# Patient Record
Sex: Female | Born: 1985 | Race: Black or African American | Hispanic: No | Marital: Single | Smoking: Never smoker
Health system: Southern US, Community
[De-identification: ages and names within clinical notes are randomized; demographics above are authoritative.]

## PROBLEM LIST (undated history)

## (undated) HISTORY — PX: PELVIC ABCESS DRAINAGE: SHX2189

---

## 2013-06-27 ENCOUNTER — Ambulatory Visit (INDEPENDENT_AMBULATORY_CARE_PROVIDER_SITE_OTHER): Payer: BC Managed Care – PPO | Admitting: Podiatry

## 2013-06-27 ENCOUNTER — Encounter: Payer: Self-pay | Admitting: Podiatry

## 2013-06-27 ENCOUNTER — Ambulatory Visit (INDEPENDENT_AMBULATORY_CARE_PROVIDER_SITE_OTHER): Payer: BC Managed Care – PPO

## 2013-06-27 VITALS — BP 100/76 | HR 55 | Resp 16 | Ht 66.0 in | Wt 216.0 lb

## 2013-06-27 DIAGNOSIS — M79609 Pain in unspecified limb: Secondary | ICD-10-CM

## 2013-06-27 DIAGNOSIS — M79671 Pain in right foot: Secondary | ICD-10-CM

## 2013-06-27 DIAGNOSIS — M722 Plantar fascial fibromatosis: Secondary | ICD-10-CM

## 2013-06-27 MED ORDER — MELOXICAM 15 MG PO TABS: 15.0000 mg | ORAL_TABLET | Freq: Every day | ORAL | Status: DC

## 2013-06-27 MED ORDER — TRIAMCINOLONE ACETONIDE 10 MG/ML IJ SUSP
5.0000 mg | Freq: Once | INTRAMUSCULAR | Status: AC
  Administered 2013-06-27: 5 mg via INTRA_ARTICULAR

## 2013-06-27 NOTE — Progress Notes (Signed)
Subjective:     Patient ID: Vanessa Stephenson, female   DOB: Jan 15, 1986, 27 y.o.   MRN: [id number]  Foot Pain   patient presents stating I'm having pain and burning in my right heel and I have a small crack in the outside of my right heel. States it's been hurting for around a month and worse when getting up in the morning or after periods of sitting   Review of Systems  All other systems reviewed and are negative.       Objective:   Physical Exam  Nursing note and vitals reviewed. Constitutional: She is oriented to person, place, and time. She appears well-developed and well-nourished.  Cardiovascular: Intact distal pulses.   Musculoskeletal: Normal range of motion.  Neurological: She is oriented to person, place, and time.  Skin: Skin is warm.   patient is found to have pain in the plantar right heel central and medial band and a small crack in the outside of the right heel measuring about half a centimeter with no subcutaneous exposure. No equinus condition noted or muscle strength loss. Vibratory sensation intact both feet    Assessment:     Plantar fasciitis right heel with slight compensation to the outside of the foot leading to a small crack    Plan:     X-ray an H&P reviewed with patient. Injected the right plantar fascia 3 mg Kenalog 5 mg like a Marcaine mixture and applied fascial brace for support. Reappoint in 2 weeks
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
## 2013-07-11 ENCOUNTER — Ambulatory Visit: Payer: BC Managed Care – PPO | Admitting: Podiatry

## 2013-08-01 ENCOUNTER — Ambulatory Visit: Payer: BC Managed Care – PPO | Admitting: Podiatry

## 2014-01-31 ENCOUNTER — Emergency Department: Payer: Self-pay | Admitting: Emergency Medicine

## 2014-01-31 LAB — BASIC METABOLIC PANEL
ANION GAP: 6 — AB (ref 7–16)
BUN: 9 mg/dL (ref 7–18)
CHLORIDE: 106 mmol/L (ref 98–107)
CREATININE: 0.97 mg/dL (ref 0.60–1.30)
Calcium, Total: 8.6 mg/dL (ref 8.5–10.1)
Co2: 29 mmol/L (ref 21–32)
EGFR (African American): >60
EGFR (Non-African Amer.): >60
GLUCOSE: 98 mg/dL (ref 65–99)
OSMOLALITY: 280 (ref 275–301)
Potassium: 3.4 mmol/L — ABNORMAL LOW (ref 3.5–5.1)
SODIUM: 141 mmol/L (ref 136–145)

## 2014-01-31 LAB — CBC
HCT: 34.9 % — AB (ref 35.0–47.0)
HGB: 11.4 g/dL — ABNORMAL LOW (ref 12.0–16.0)
MCH: 30.2 pg (ref 26.0–34.0)
MCHC: 32.7 g/dL (ref 32.0–36.0)
MCV: 93 fL (ref 80–100)
Platelet: 214 10*3/uL (ref 150–440)
RBC: 3.77 10*6/uL — ABNORMAL LOW (ref 3.80–5.20)
RDW: 14.9 % — AB (ref 11.5–14.5)
WBC: 5.6 10*3/uL (ref 3.6–11.0)

## 2014-01-31 LAB — TROPONIN I: Troponin-I: <0.02 ng/mL

## 2014-02-01 LAB — D-DIMER(ARMC): D-Dimer: 1730 ng/ml

## 2014-02-01 LAB — TROPONIN I

## 2014-09-10 DIAGNOSIS — N7093 Salpingitis and oophoritis, unspecified: Secondary | ICD-10-CM | POA: Insufficient documentation

## 2014-09-10 DIAGNOSIS — A549 Gonococcal infection, unspecified: Secondary | ICD-10-CM | POA: Insufficient documentation

## 2015-05-23 IMAGING — CR DG CHEST 2V
1 series · 2 of 2 positions shown · non-contrast
Comparison: None.

CLINICAL DATA: Wheezing and chest pain

EXAM:
CHEST  2 VIEW

[Series 1: w chest pa · 0.14mm/px · 2 of 2 slices shown]
[im 1/2]
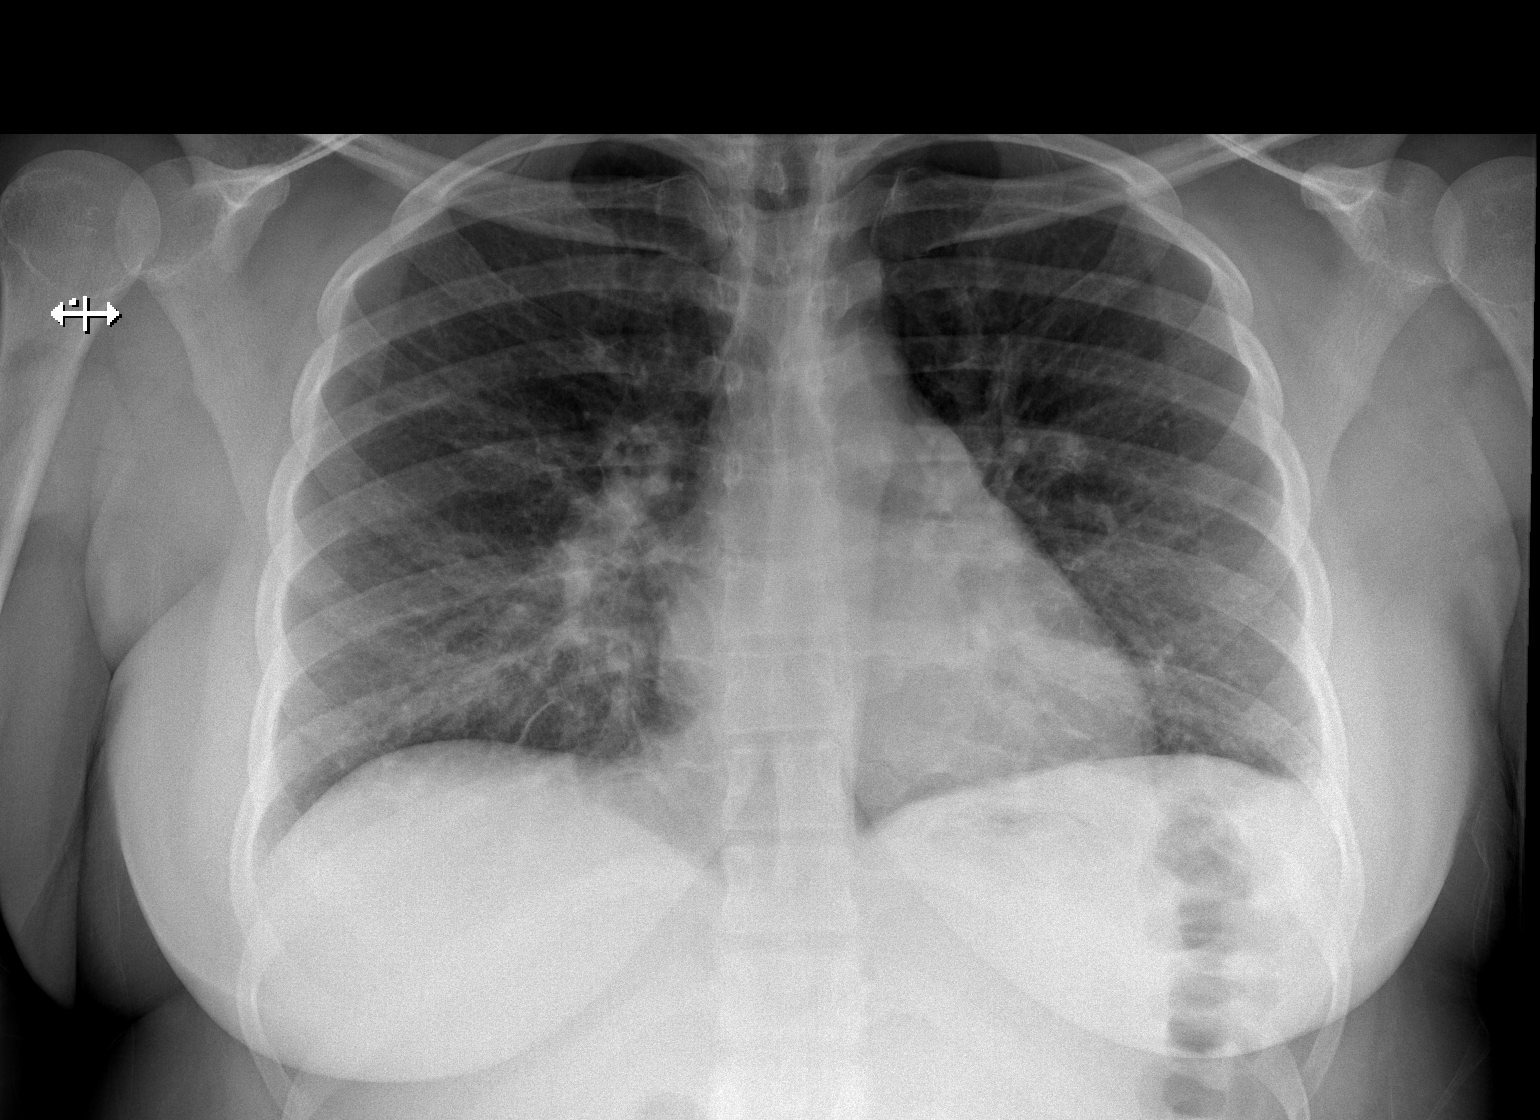
[im 2/2]
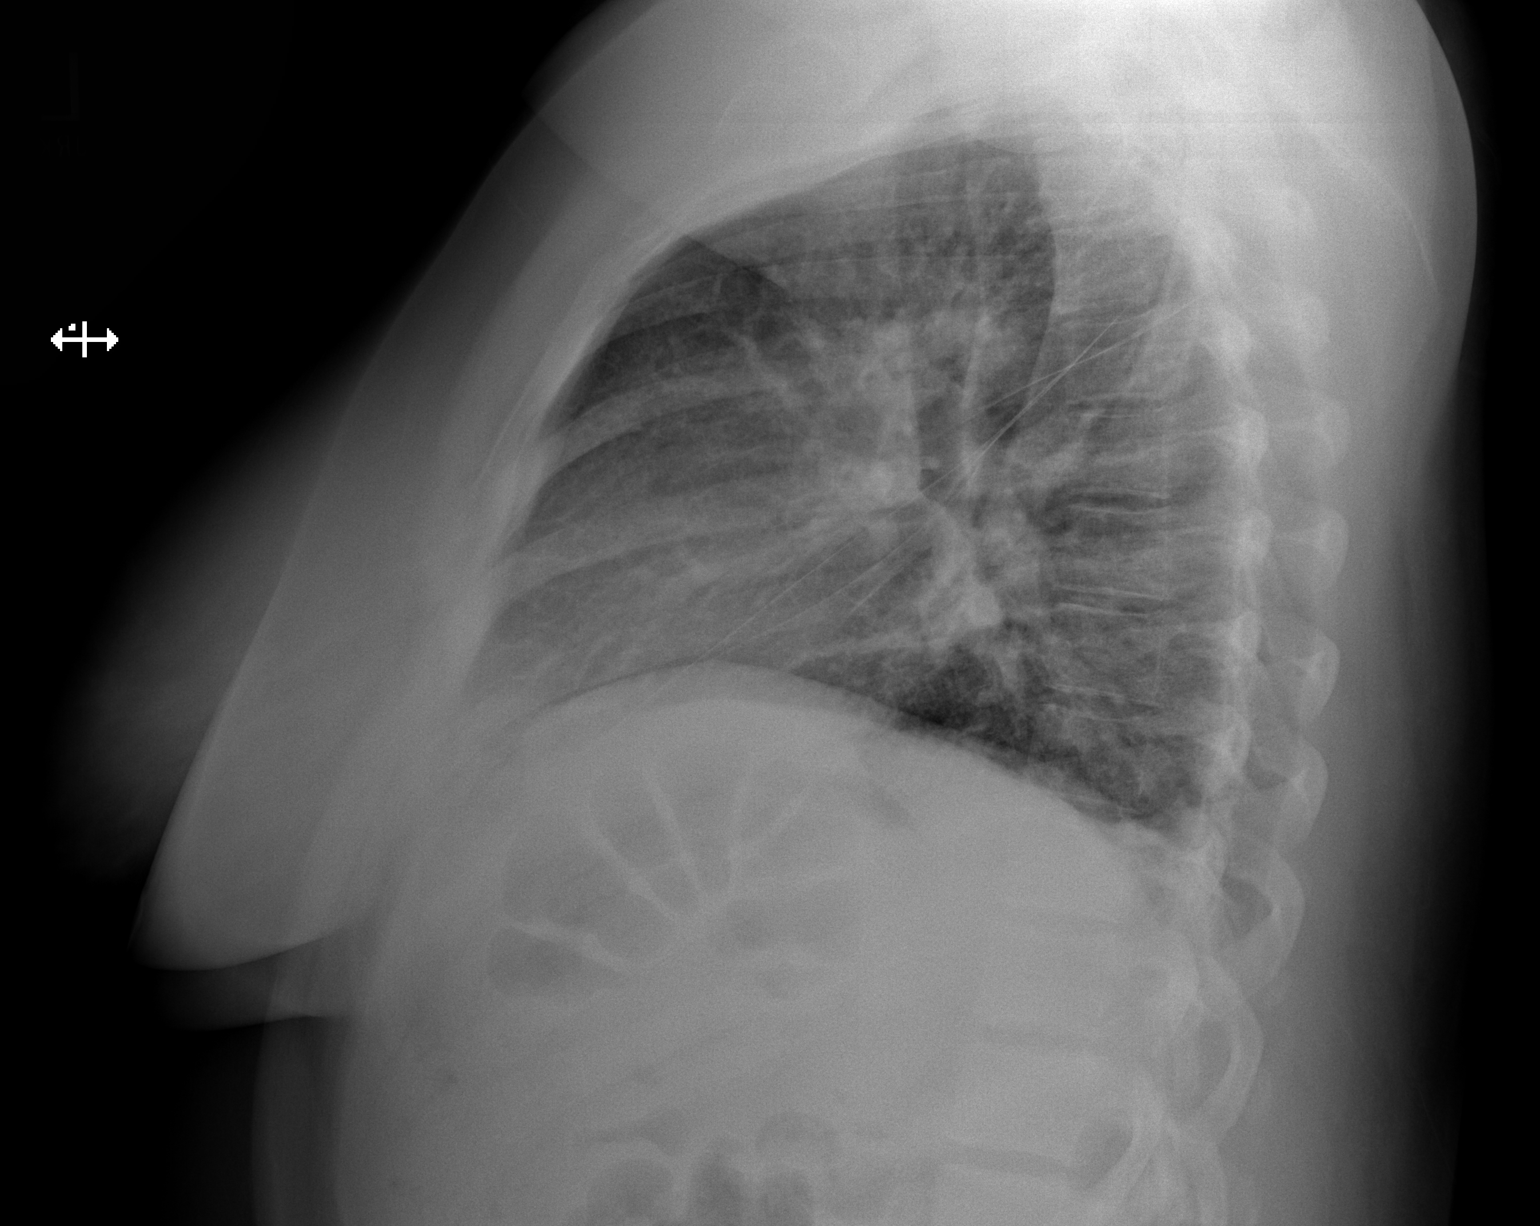

[2 of 2 positions shown; findings below may reference images not displayed]

FINDINGS: There is central peribronchial thickening. There is no edema or
consolidation. Heart size and pulmonary vascularity are normal. No
adenopathy. No bony lesions.
IMPRESSION: No edema or consolidation.  Evidence of central bronchitis

## 2018-04-14 ENCOUNTER — Other Ambulatory Visit: Payer: Self-pay

## 2018-04-14 ENCOUNTER — Emergency Department: Payer: BLUE CROSS/BLUE SHIELD

## 2018-04-14 ENCOUNTER — Emergency Department
Admission: EM | Admit: 2018-04-14 | Discharge: 2018-04-14 | Disposition: A | Discharge status: Home / Self Care | Payer: BLUE CROSS/BLUE SHIELD | Attending: Emergency Medicine | Admitting: Emergency Medicine

## 2018-04-14 DIAGNOSIS — S5012XA Contusion of left forearm, initial encounter: Secondary | ICD-10-CM | POA: Insufficient documentation

## 2018-04-14 DIAGNOSIS — Y998 Other external cause status: Secondary | ICD-10-CM | POA: Insufficient documentation

## 2018-04-14 DIAGNOSIS — Y9389 Activity, other specified: Secondary | ICD-10-CM | POA: Insufficient documentation

## 2018-04-14 DIAGNOSIS — S20219A Contusion of unspecified front wall of thorax, initial encounter: Secondary | ICD-10-CM | POA: Diagnosis not present

## 2018-04-14 DIAGNOSIS — S299XXA Unspecified injury of thorax, initial encounter: Secondary | ICD-10-CM | POA: Diagnosis present

## 2018-04-14 DIAGNOSIS — Y92410 Unspecified street and highway as the place of occurrence of the external cause: Secondary | ICD-10-CM | POA: Insufficient documentation
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
## 2018-04-14 NOTE — Discharge Instructions (Addendum)
Of your primary care provider or [facility] acute care if any continued problems.  Begin taking Aleve 2 tablets twice a day with food for the next 7 days.  May use ice to muscles and chest wall as needed for swelling or bruising.
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
## 2022-03-07 ENCOUNTER — Other Ambulatory Visit: Payer: Self-pay | Admitting: Orthopedic Surgery

## 2022-03-07 DIAGNOSIS — M545 Low back pain, unspecified: Secondary | ICD-10-CM

## 2022-03-12 DIAGNOSIS — M436 Torticollis: Secondary | ICD-10-CM | POA: Diagnosis not present

## 2022-03-12 DIAGNOSIS — M542 Cervicalgia: Secondary | ICD-10-CM | POA: Diagnosis present

## 2022-03-13 ENCOUNTER — Emergency Department
Admission: EM | Admit: 2022-03-13 | Discharge: 2022-03-13 | Disposition: A | Discharge status: Home / Self Care | Payer: BC Managed Care – PPO | Attending: Emergency Medicine | Admitting: Emergency Medicine

## 2022-03-13 ENCOUNTER — Other Ambulatory Visit: Payer: Self-pay

## 2022-03-13 DIAGNOSIS — M62838 Other muscle spasm: Secondary | ICD-10-CM

## 2022-03-13 DIAGNOSIS — M436 Torticollis: Secondary | ICD-10-CM

## 2022-03-13 MED ORDER — LIDOCAINE HCL (PF) 1 % IJ SOLN
10.0000 mL | Freq: Once | INTRAMUSCULAR | Status: AC
  Administered 2022-03-13: 10 mL
  Filled 2022-03-13: qty 10

## 2022-03-13 MED ORDER — KETOROLAC TROMETHAMINE 30 MG/ML IJ SOLN
30.0000 mg | Freq: Once | INTRAMUSCULAR | Status: AC
  Administered 2022-03-13: 30 mg via INTRAMUSCULAR
  Filled 2022-03-13: qty 1

## 2022-03-13 NOTE — ED Provider Notes (Signed)
[facility] Provider Note    Event Date/Time   First MD Initiated Contact with Patient 03/13/22 0109     (approximate)   History   Neck Pain   HPI  Sudan Milayna Rotenberg is a 36 y.o. female who presents to the ED for evaluation of Neck Pain   Patient presents for the evaluation of 3 days of progressive left-sided neck pain and feeling like her head is stuck tilted to the left and with decreased mobility.  No fevers, falls or trauma.  No syncope, vision changes or weakness to the extremities.  Went to urgent care a couple days ago and had a shot of Toradol with some minimal improvement, but the pain and tightness came back so she comes to [place].   Physical Exam   Triage Vital Signs: ED Triage Vitals  Enc Vitals Group     BP 03/13/22 0110 135/73     Pulse Rate 03/13/22 0110 75     Resp 03/13/22 0110 16     Temp 03/13/22 0110 98.8 F (37.1 C)     Temp Source 03/13/22 0110 Oral     SpO2 03/13/22 0110 100 %     Weight 03/13/22 0111 236 lb (107 kg)     Height 03/13/22 0111 5\' 6"  (1.676 m)     Head Circumference --      Peak Flow --      Pain Score 03/13/22 0111 10     Pain Loc --      Pain Edu? --      Excl. in GC? --     Most recent vital signs: Vitals:   03/13/22 0110  BP: 135/73  Pulse: 75  Resp: 16  Temp: 98.8 F (37.1 C)  SpO2: 100%    General: Awake, no distress.  Head tilted to the left.  She look systemically well, is ambulatory and pleasant and conversational full sentences. CV:  Good peripheral perfusion.  Resp:  Normal effort.  Abd:  No distention.  MSK:  No deformity noted.  Tenderness to the bilateral paraspinal cervical musculature.  No midline tenderness or signs of trauma to the back.  Lesser tenderness to bilateral trapezius muscles. Neuro:  No focal deficits appreciated. Cranial nerves II through XII intact 5/5 strength and sensation in all 4 extremities Other:  No meningismus   ED Results / Procedures / Treatments    Labs (all labs ordered are listed, but only abnormal results are displayed) Labs Reviewed - No data to display  EKG   RADIOLOGY   Official radiology report(s): No results found.  PROCEDURES and INTERVENTIONS:  Procedures  Trigger point injection After discussing risks versus benefits, patient is agreeable to proceed with trigger point injection for treatment of acute muscular spasm. Risks discussed include worsening pain, infection, neurovascular damage and pneumothorax.  Point of maximal tenderness was identified at bilateral paraspinal cervical musculature Overlying skin was cleaned with alcohol swabs. 1% lidocaine without epinephrine was incrementally injected, after confirming negative drawback, into this musculature. Total of 5 milliliters injected. Well-tolerated without any apparent complications.   Medications  lidocaine (PF) (XYLOCAINE) 1 % injection 10 mL (10 mLs Infiltration Given 03/13/22 0153)  ketorolac (TORADOL) 30 MG/ML injection 30 mg (30 mg Intramuscular Given 03/13/22 0132)     IMPRESSION / MDM / ASSESSMENT AND PLAN / ED COURSE  I reviewed the triage vital signs and the nursing notes.  Differential diagnosis includes, but is not limited to, meningitis, cephalitis, cervical fracture, torticollis, muscular  strain or spasm.  Pleasant 36 year old female presents to the ED with stigmata of torticollis suitable for outpatient management after trigger point injection.  Looks systemically well and has no evidence of neurologic or vascular deficits.  No signs of trauma.  Notifications for spinal or CNS imaging.  Perform trigger point injection, as above, with improvement of her symptoms and return of mobility of her neck.  We discussed management at home and return precautions.  Clinical Course as of 03/13/22 0319  Mon Mar 13, 2022  0145 Trigger point injection performed. Well tolerated. 2 spots, 5cc total [DS]  0208 Reassessed.  Freely ranging her neck now.  reports some mild residual soreness, but better.  We discussed management at home and return precautions. [DS]    Clinical Course User Index [DS] Delton Prairie, MD     FINAL CLINICAL IMPRESSION(S) / ED DIAGNOSES   Final diagnoses:  Torticollis, acute  Muscle spasms of neck     Rx / DC Orders   ED Discharge Orders     None        Note:  This document was prepared using Dragon voice recognition software and may include unintentional dictation errors.   Delton Prairie, MD 03/13/22 [phone]

## 2022-03-13 NOTE — Discharge Instructions (Addendum)
Please take Tylenol and ibuprofen/Advil for your pain.  It is safe to take them together, or to alternate them every few hours.  Take up to 1000mg of Tylenol at a time, up to 4 times per day.  Do not take more than 4000 mg of Tylenol in 24 hours.  For ibuprofen, take 400-600 mg, 3 - 4 times per day.  

## 2022-03-13 NOTE — ED Triage Notes (Signed)
Patient reports neck pain since Thursday. Denies injury, Unable to move neck. Reports neck spasms. AOX4. Resp even, unlabored on RA.

## 2022-03-14 ENCOUNTER — Emergency Department
Admission: EM | Admit: 2022-03-14 | Discharge: 2022-03-14 | Disposition: A | Discharge status: Home / Self Care | Payer: BC Managed Care – PPO | Attending: Emergency Medicine | Admitting: Emergency Medicine

## 2022-03-14 ENCOUNTER — Other Ambulatory Visit: Payer: Self-pay

## 2022-03-14 DIAGNOSIS — M436 Torticollis: Secondary | ICD-10-CM | POA: Insufficient documentation

## 2022-03-14 DIAGNOSIS — M62838 Other muscle spasm: Secondary | ICD-10-CM | POA: Diagnosis present

## 2022-03-14 MED ORDER — IBUPROFEN 800 MG PO TABS
ORAL_TABLET | ORAL | Status: AC
  Administered 2022-03-14: 800 mg via ORAL
  Filled 2022-03-14: qty 1

## 2022-03-14 MED ORDER — IBUPROFEN 800 MG PO TABS
800.0000 mg | ORAL_TABLET | Freq: Once | ORAL | Status: AC
  Administered 2022-03-14: 800 mg via ORAL

## 2022-03-14 MED ORDER — PREDNISONE 20 MG PO TABS: 40.0000 mg | ORAL_TABLET | Freq: Every day | ORAL | 0 refills | Status: AC

## 2022-03-14 MED ORDER — TIZANIDINE HCL 2 MG PO TABS: 2.0000 mg | ORAL_TABLET | Freq: Three times a day (TID) | ORAL | 0 refills | Status: AC

## 2022-03-14 NOTE — ED Notes (Signed)
PA at bedside discussing possible discharge from triage with prescriptions. Pt drove self here.

## 2022-03-14 NOTE — Discharge Instructions (Addendum)
Take prescription meds as directed.  Apply moist heat to help reduce symptoms.  Follow-up with primary provider or local urgent care for ongoing symptoms.

## 2022-03-14 NOTE — ED Triage Notes (Signed)
Pt to ED for muscle spasms in neck since Friday (4d ago). Neck is inclined to L side, pt states cannot move neck to neutral position. Seen here for same on Sunday (2d ago), was given shots in neck, which helped for a few minutes but after this was discharged and states has not improved at all.  States everything started after she felt a "pop" in R ear. No known injury or other precipitating factor.

## 2022-03-14 NOTE — ED Notes (Signed)
Registration on way to register pt so can be discharged from triage.

## 2022-03-14 NOTE — ED Provider Notes (Signed)
[facility] Emergency Department Provider Note     Event Date/Time   First MD Initiated Contact with Patient 03/14/22 1459     (approximate)   History   Spasms   HPI  Vanessa Stephenson is a 36 y.o. female returns to the ED for persistent neck spasm and decreased range of motion.  Evaluated for the ED yesterday for the same complaint, discharged with a diagnosis of acute torticollis, but did not receive any prescription medications for ongoing relief.  She received injections in the ED, reported some limited benefit.  She denies any reinjury, chest pain, shortness of breath, weakness, or distal paresthesias.   Physical Exam   Triage Vital Signs: ED Triage Vitals  Enc Vitals Group     BP 03/14/22 1446 (!) 150/94     Pulse Rate 03/14/22 1446 79     Resp 03/14/22 1446 16     Temp 03/14/22 1446 98.5 F (36.9 C)     Temp Source 03/14/22 1446 Oral     SpO2 03/14/22 1446 100 %     Weight 03/14/22 1447 235 lb 14.3 oz (107 kg)     Height 03/14/22 1447 5\' 6"  (1.676 m)     Head Circumference --      Peak Flow --      Pain Score 03/14/22 1449 10     Pain Loc --      Pain Edu? --      Excl. in GC? --     Most recent vital signs: Vitals:   03/14/22 1446  BP: (!) 150/94  Pulse: 79  Resp: 16  Temp: 98.5 F (36.9 C)  SpO2: 100%    General Awake, no distress.  HEENT NCAT. PERRL. EOMI. No rhinorrhea. Mucous membranes are moist.  CV:  Good peripheral perfusion.  RESP:  Normal effort.  ABD:  No distention.  MSK:  Normal spinal alignment except for some persistent left lateral positioning of the neck.  Patient with some tenderness to palpation across the bilateral trapezius musculature.  Normal composite fist distally. NEURO: CN II-XII grossly intact   ED Results / Procedures / Treatments   Labs (all labs ordered are listed, but only abnormal results are displayed) Labs Reviewed - No data to display   EKG   RADIOLOGY   No results  found.   PROCEDURES:  Critical Care performed: No  Procedures   MEDICATIONS ORDERED IN ED: Medications  ibuprofen (ADVIL) tablet 800 mg (800 mg Oral Given 03/14/22 1513)     IMPRESSION / MDM / ASSESSMENT AND PLAN / ED COURSE  I reviewed the triage vital signs and the nursing notes.                              Differential diagnosis includes, but is not limited to, torticollis, cervical fracture, whiplash, cervical radiculopathy  Patient's presentation is most consistent with acute, uncomplicated illness.  Patient's diagnosis is consistent with acute torticollis. Patient will be discharged home with prescriptions for prednisone and tizanidine. Patient is to follow up with primary provider as needed or otherwise directed. Patient is given ED precautions to return to the ED for any worsening or new symptoms.     FINAL CLINICAL IMPRESSION(S) / ED DIAGNOSES   Final diagnoses:  Torticollis, acute     Rx / DC Orders   ED Discharge Orders          Ordered  tiZANidine (ZANAFLEX) 2 MG tablet  3 times daily        03/14/22 1501    predniSONE (DELTASONE) 20 MG tablet  Daily with breakfast        03/14/22 1501             Note:  This document was prepared using Dragon voice recognition software and may include unintentional dictation errors.    Lissa Hoard, PA-C 03/14/22 1659    Gilles Chiquito, MD 03/14/22 1719

## 2022-03-24 ENCOUNTER — Encounter: Payer: Self-pay | Admitting: Family

## 2022-03-24 ENCOUNTER — Ambulatory Visit (INDEPENDENT_AMBULATORY_CARE_PROVIDER_SITE_OTHER): Payer: BC Managed Care – PPO | Admitting: Family

## 2022-03-24 VITALS — BP 126/84 | HR 78 | Temp 98.4°F | Ht 66.0 in | Wt 240.5 lb

## 2022-03-24 DIAGNOSIS — M436 Torticollis: Secondary | ICD-10-CM | POA: Diagnosis not present

## 2022-03-24 DIAGNOSIS — Z113 Encounter for screening for infections with a predominantly sexual mode of transmission: Secondary | ICD-10-CM

## 2022-03-24 DIAGNOSIS — Z0001 Encounter for general adult medical examination with abnormal findings: Secondary | ICD-10-CM

## 2022-03-24 DIAGNOSIS — Z3169 Encounter for other general counseling and advice on procreation: Secondary | ICD-10-CM | POA: Insufficient documentation

## 2022-03-24 DIAGNOSIS — H6122 Impacted cerumen, left ear: Secondary | ICD-10-CM

## 2022-03-24 NOTE — Patient Instructions (Addendum)
Welcome to [facility], It was a pleasure meeting you today!   I will review your lab results via MyChart in a few days. Schedule an appointment to come back to have your PAP smear done.  I have sent a referral to our physical therapy dept - they will call you directly to schedule.  OK to take generic Aleve, 1-2 pills, twice a day as needed. Continue the Tizanidine tabs at bedtime if needed. Can use heat or analgesic creams, patches on your neck to help with stiffness & pain. See the attached handout for more tips!  Have a great weekend!      PLEASE NOTE: If you had any LAB tests please let us know if you have not heard back within a few days. You may see your results on MyChart before we have a chance to review them but we will give you a call once they are reviewed by [place]. If we ordered any REFERRALS today, please let us know if you have not heard from their office within the next week.  Let us know through MyChart if you are needing REFILLS, or have your pharmacy send [place] the request. You can also use MyChart to communicate with me or any office staff.   Please try these tips to maintain a healthy lifestyle:  Eat most of your calories during the day when you are active. Eliminate processed foods including packaged sweets (pies, cakes, cookies), reduce intake of potatoes, white bread, white pasta, and white rice. Look for whole grain options, oat flour or almond flour.  Each meal should contain half fruits/vegetables, one quarter protein, and one quarter carbs (no bigger than a computer mouse).  Cut down on sweet beverages. This includes juice, soda, and sweet tea. Also watch fruit intake, though this is a healthier sweet option, it still contains natural sugar! Limit to 3 servings daily.  Drink at least 1 8oz. glass of water with each meal and aim for at least 8 glasses per day  Exercise at least 150 minutes every week.

## 2022-03-24 NOTE — Progress Notes (Signed)
Phone [phone]  Subjective:   Vanessa Stephenson is a 36 y.o. female presenting for annual physical.    Chief Complaint  Vanessa Stephenson presents with   Establish Care    Fasting W/ Labs    Neck Pain    Pt c/o on 7/16, pt had a neck spasm and her neck was stuck on her shoulder. Pt went to the ED and was given neck shots and toroidal. Pt states her neck feels weak and she wants test run to see what it was going on.    HPI: Torticollis:  went to ER 10d ago after waking and unable to lift her neck off of her left shoulder; received 2 Toradol shots and Lidocaine directly in neck muscles, but no relief until she started the prednisone pack and Tizanidine, taking at bedtime prn now. Doing much better now, just mild stiffness.  See problem oriented charting- ROS- full  review of systems was completed and negative except for Neck pain noted in HPI above.  The following were reviewed and entered/updated in epic: History reviewed. No pertinent past medical history. Vanessa Stephenson Active Problem List   Diagnosis Date Noted   Infertility counseling 03/24/2022   Gonorrhea 09/10/2014   Tubal ovarian abscess 09/10/2014   Past Surgical History:  Procedure Laterality Date   PELVIC ABCESS DRAINAGE      History reviewed. No pertinent family history.  Medications- reviewed and updated No current outpatient medications on file.   No current facility-administered medications for this visit.    Allergies-reviewed and updated No Known Allergies  Social History   Social History Narrative   Not on file    Objective:  BP 126/84 (BP Location: Left Arm, Vanessa Stephenson Position: Sitting, Cuff Size: Large)   Pulse 78   Temp 98.4 F (36.9 C) (Temporal)   Ht 5\' 6"  (1.676 m)   Wt 240 lb 8 oz (109.1 kg)   LMP 02/25/2022 (Approximate)   SpO2 98%   BMI 38.82 kg/m  Physical Exam Vitals and nursing note reviewed.  Constitutional:      Appearance: Normal appearance.  HENT:     Head: Normocephalic.     Right Ear:  Tympanic membrane normal.     Left Ear: Tympanic membrane normal.     Nose: Nose normal.     Mouth/Throat:     Mouth: Mucous membranes are moist.  Eyes:     Pupils: Pupils are equal, round, and reactive to light.  Cardiovascular:     Rate and Rhythm: Normal rate and regular rhythm.  Pulmonary:     Effort: Pulmonary effort is normal.     Breath sounds: Normal breath sounds.  Musculoskeletal:        General: Normal range of motion.     Cervical back: Normal range of motion.  Lymphadenopathy:     Cervical: No cervical adenopathy.  Skin:    General: Skin is warm and dry.  Neurological:     Mental Status: She is alert.  Psychiatric:        Mood and Affect: Mood normal.        Behavior: Behavior normal.      Assessment and Plan   Health Maintenance counseling: 1. Anticipatory guidance: Vanessa Stephenson counseled regarding regular dental exams q6 months, eye exams,  avoiding smoking and second hand smoke, limiting alcohol to 1 beverage per day, no illicit drugs.   2. Risk factor reduction:  Advised Vanessa Stephenson of need for regular exercise and diet rich with fruits and vegetables to reduce risk of heart  attack and stroke. Wt Readings from Last 3 Encounters:  03/24/22 240 lb 8 oz (109.1 kg)  03/14/22 235 lb 14.3 oz (107 kg)  03/13/22 236 lb (107 kg)   3. Immunizations/screenings/ancillary studies  There is no immunization history on file for this Vanessa Stephenson. Health Maintenance Due  Topic Date Due   HIV Screening  Never done   Hepatitis C Screening  Never done   TETANUS/TDAP  Never done   PAP SMEAR-Modifier  Never done    4. Cervical cancer screening: due, will reschedule 5. Skin cancer screening- advised regular sunscreen use. Denies worrisome, changing, or new skin lesions.  6. Birth control/STD check: none 7. Smoking associated screening: non- smoker 8. Alcohol screening: very rarely  Problem List Items Addressed This Visit   None Visit Diagnoses     Encounter for general adult  medical examination with abnormal findings    -  Primary   Relevant Orders   Comprehensive metabolic panel   TSH   Lipid panel   CBC with Differential/Platelet   HIV antibody (with reflex)   RPR   Screening examination for STD (sexually transmitted disease)       Relevant Orders   Urine cytology ancillary only   Impacted cerumen of left ear    - Verbal consent received to perform left ear lavage via Hydrogen peroxide/water mix solution. Pt tolerated well, complete evacuation of all cerumen obtained. Mild erythema but no bleeding noted in ear canal after procedure.    Relevant Orders   Ear Lavage   Torticollis, acute    - pt reports waking up and unable to move her neck, tilted to the left side, she reports going to work, took [employer], but too painful and went to ER. Doing much better now, but sending to PT to work on ergonomics, daily stretches to do for prevention of future episodes.    Relevant Orders   Ambulatory referral to Physical Therapy      Recommended follow up:  Return for PAP smear only. Future Appointments  Date Time Provider Department Center  03/31/2022  8:40 AM Dulce Sellar, NP [facility]  03/31/2022  9:30 AM [facility]  04/06/2022 12:30 PM [facility] RM 3 [facility]  04/06/2022 12:30 PM [facility] CT 1 [facility]    Lab/Order associations: fasting    Dulce Sellar, NP

## 2022-03-31 ENCOUNTER — Other Ambulatory Visit (INDEPENDENT_AMBULATORY_CARE_PROVIDER_SITE_OTHER): Payer: BC Managed Care – PPO

## 2022-03-31 ENCOUNTER — Other Ambulatory Visit (HOSPITAL_COMMUNITY)
Admission: RE | Admit: 2022-03-31 | Discharge: 2022-03-31 | Disposition: A | Discharge status: Home / Self Care | Payer: BC Managed Care – PPO | Source: Ambulatory Visit | Attending: Family | Admitting: Family

## 2022-03-31 ENCOUNTER — Encounter: Payer: Self-pay | Admitting: Family

## 2022-03-31 ENCOUNTER — Ambulatory Visit (INDEPENDENT_AMBULATORY_CARE_PROVIDER_SITE_OTHER): Payer: BC Managed Care – PPO | Admitting: Family

## 2022-03-31 VITALS — BP 117/81 | HR 89 | Temp 98.0°F | Ht 66.0 in | Wt 236.5 lb

## 2022-03-31 DIAGNOSIS — Z113 Encounter for screening for infections with a predominantly sexual mode of transmission: Secondary | ICD-10-CM

## 2022-03-31 DIAGNOSIS — D500 Iron deficiency anemia secondary to blood loss (chronic): Secondary | ICD-10-CM

## 2022-03-31 DIAGNOSIS — Z124 Encounter for screening for malignant neoplasm of cervix: Secondary | ICD-10-CM

## 2022-03-31 DIAGNOSIS — Z23 Encounter for immunization: Secondary | ICD-10-CM

## 2022-03-31 DIAGNOSIS — Z0000 Encounter for general adult medical examination without abnormal findings: Secondary | ICD-10-CM | POA: Diagnosis present

## 2022-03-31 LAB — CBC WITH DIFFERENTIAL/PLATELET
Basophils Absolute: 0 10*3/uL (ref 0.0–0.1)
Basophils Relative: 0.3 % (ref 0.0–3.0)
Eosinophils Absolute: 0.2 10*3/uL (ref 0.0–0.7)
Eosinophils Relative: 2.7 % (ref 0.0–5.0)
HCT: 29.7 % — ABNORMAL LOW (ref 36.0–46.0)
Hemoglobin: 9.3 g/dL — ABNORMAL LOW (ref 12.0–15.0)
Lymphocytes Relative: 29.9 % (ref 12.0–46.0)
Lymphs Abs: 1.9 10*3/uL (ref 0.7–4.0)
MCHC: 31.5 g/dL (ref 30.0–36.0)
MCV: 74.4 fl — ABNORMAL LOW (ref 78.0–100.0)
Monocytes Absolute: 0.6 10*3/uL (ref 0.1–1.0)
Monocytes Relative: 9.2 % (ref 3.0–12.0)
Neutro Abs: 3.6 10*3/uL (ref 1.4–7.7)
Neutrophils Relative %: 57.9 % (ref 43.0–77.0)
Platelets: 297 10*3/uL (ref 150.0–400.0)
RBC: 3.99 Mil/uL (ref 3.87–5.11)
RDW: 19.1 % — ABNORMAL HIGH (ref 11.5–15.5)
WBC: 6.2 10*3/uL (ref 4.0–10.5)

## 2022-03-31 LAB — COMPREHENSIVE METABOLIC PANEL
ALT: 18 U/L (ref 0–35)
AST: 18 U/L (ref 0–37)
Albumin: 3.8 g/dL (ref 3.5–5.2)
Alkaline Phosphatase: 76 U/L (ref 39–117)
BUN: 9 mg/dL (ref 6–23)
CO2: 28 mEq/L (ref 19–32)
Calcium: 9.1 mg/dL (ref 8.4–10.5)
Chloride: 103 mEq/L (ref 96–112)
Creatinine, Ser: 0.82 mg/dL (ref 0.40–1.20)
GFR: 91.18 mL/min (ref >60.00)
Glucose, Bld: 90 mg/dL (ref 70–99)
Potassium: 4.3 mEq/L (ref 3.5–5.1)
Sodium: 138 mEq/L (ref 135–145)
Total Bilirubin: 0.3 mg/dL (ref 0.2–1.2)
Total Protein: 6.9 g/dL (ref 6.0–8.3)

## 2022-03-31 LAB — LIPID PANEL
Cholesterol: 182 mg/dL (ref 0–200)
HDL: 58.8 mg/dL (ref >39.00)
NonHDL: 123.5
Total CHOL/HDL Ratio: 3
Triglycerides: 201 mg/dL — ABNORMAL HIGH (ref 0.0–149.0)
VLDL: 40.2 mg/dL — ABNORMAL HIGH (ref 0.0–40.0)

## 2022-03-31 LAB — LDL CHOLESTEROL, DIRECT: Direct LDL: 53 mg/dL

## 2022-03-31 LAB — TSH: TSH: 2.35 u[IU]/mL (ref 0.35–5.50)

## 2022-03-31 NOTE — Progress Notes (Signed)
   Patient ID: Vanessa Stephenson, female    DOB: Mar 05, 1986, 36 y.o.   MRN: [id number]  Chief Complaint  Patient presents with   Gynecologic Exam   Annual Exam    Fasting W/ Labs    HPI: PAP smear:  pt here today for testing, on last day of menses, having light spotting, denies any vaginal concerns, will also send for STD testing. Pt also getting annual physical labs done today as lab closed on day of physical last week.   Assessment & Plan:   Problem List Items Addressed This Visit   None Visit Diagnoses     Need for vaccination    -  Primary   Relevant Orders   Tdap vaccine greater than or equal to 7yo IM   Screen for STD (sexually transmitted disease)       Relevant Orders   Cytology - PAP   Encounter for Pap smear of cervix with HPV DNA cotesting       Relevant Orders   Cytology - PAP       Subjective:    No outpatient medications prior to visit.   No facility-administered medications prior to visit.   No past medical history on file. Past Surgical History:  Procedure Laterality Date   PELVIC ABCESS DRAINAGE     No Known Allergies    Objective:    Physical Exam Vitals and nursing note reviewed.  Constitutional:      Appearance: Normal appearance.  Cardiovascular:     Rate and Rhythm: Normal rate and regular rhythm.  Pulmonary:     Effort: Pulmonary effort is normal.     Breath sounds: Normal breath sounds.  Genitourinary:    Exam position: Lithotomy position.     Pubic Area: No rash or pubic lice.      Labia:        Right: No rash or tenderness.        Left: No rash or tenderness.      Vagina: Normal.     Cervix: Cervical bleeding (mild, last day of cycle) present.     Comments: PAP smear specimen obtained Musculoskeletal:        General: Normal range of motion.  Skin:    General: Skin is warm and dry.  Neurological:     Mental Status: She is alert.  Psychiatric:        Mood and Affect: Mood normal.        Behavior: Behavior normal.     BP 117/81 (BP Location: Left Arm, Patient Position: Sitting, Cuff Size: Large)   Pulse 89   Temp 98 F (36.7 C) (Temporal)   Ht 5\' 6"  (1.676 m)   Wt 236 lb 8 oz (107.3 kg)   LMP 02/25/2022 (Approximate)   SpO2 100%   BMI 38.17 kg/m  Wt Readings from Last 3 Encounters:  03/31/22 236 lb 8 oz (107.3 kg)  03/24/22 240 lb 8 oz (109.1 kg)  03/14/22 235 lb 14.3 oz (107 kg)       03/16/22, NP

## 2022-04-02 LAB — HIV ANTIBODY (ROUTINE TESTING W REFLEX): HIV 1&2 Ab, 4th Generation: NONREACTIVE

## 2022-04-02 LAB — RPR: RPR Ser Ql: NONREACTIVE

## 2022-04-03 LAB — URINE CYTOLOGY ANCILLARY ONLY
Chlamydia: NEGATIVE
Comment: NEGATIVE
Comment: NEGATIVE
Comment: NORMAL
Neisseria Gonorrhea: NEGATIVE
Trichomonas: NEGATIVE

## 2022-04-04 LAB — CYTOLOGY - PAP
Chlamydia: NEGATIVE
Comment: NEGATIVE
Comment: NEGATIVE
Comment: NEGATIVE
Comment: NORMAL
Diagnosis: NEGATIVE
High risk HPV: NEGATIVE
Neisseria Gonorrhea: NEGATIVE
Trichomonas: NEGATIVE

## 2022-04-06 ENCOUNTER — Ambulatory Visit
Admission: RE | Admit: 2022-04-06 | Discharge: 2022-04-06 | Disposition: A | Discharge status: Home / Self Care | Payer: BLUE CROSS/BLUE SHIELD | Source: Ambulatory Visit | Attending: Orthopedic Surgery | Admitting: Orthopedic Surgery

## 2022-04-06 ENCOUNTER — Ambulatory Visit
Admission: RE | Admit: 2022-04-06 | Discharge: 2022-04-06 | Disposition: A | Discharge status: Home / Self Care | Payer: Self-pay | Source: Ambulatory Visit | Attending: Orthopedic Surgery | Admitting: Orthopedic Surgery

## 2022-04-06 DIAGNOSIS — M545 Low back pain, unspecified: Secondary | ICD-10-CM

## 2022-04-08 MED ORDER — FERROUS SULFATE ER 143 (45 FE) MG PO TBCR: 1.0000 | EXTENDED_RELEASE_TABLET | Freq: Every day | ORAL | 1 refills | Status: AC

## 2022-04-08 NOTE — Progress Notes (Signed)
Your glucose (sugar), electrolytes, kidney and liver function, thyroid, and cholesterol numbers are all good.  Just your total cholesterol and triglycerides were slightly high, remember to reduce the total carbs & saturated fat in your diet.  You STD testing is all negative including HIV and your PAP smear is negative, recommend rechecking in 3-5 yrs.  Your blood count indicates anemia. I have sent an extended release iron pill to your pharmacy to start once a day (Ferrous sulfate).

## 2022-04-10 ENCOUNTER — Ambulatory Visit (INDEPENDENT_AMBULATORY_CARE_PROVIDER_SITE_OTHER): Payer: BC Managed Care – PPO | Admitting: Physical Therapy

## 2022-04-10 ENCOUNTER — Encounter: Payer: Self-pay | Admitting: Physical Therapy

## 2022-04-10 DIAGNOSIS — M542 Cervicalgia: Secondary | ICD-10-CM | POA: Diagnosis not present

## 2022-04-10 NOTE — Therapy (Signed)
OUTPATIENT PHYSICAL THERAPY EVALUATION   Patient Name: Vanessa Stephenson MRN: [id number] DOB:1986/02/25, 36 y.o., female Today's Date: 04/10/2022   PT End of Session - 04/10/22 0952     Visit Number 1    Number of Visits 12    Date for PT Re-Evaluation 05/22/22    PT Start Time 0849    PT Stop Time 0929    PT Time Calculation (min) 40 min    Activity Tolerance Patient tolerated treatment well    Behavior During Therapy [facility] for tasks assessed/performed             History reviewed. No pertinent past medical history. Past Surgical History:  Procedure Laterality Date   PELVIC ABCESS DRAINAGE     Patient Active Problem List   Diagnosis Date Noted   Infertility counseling 03/24/2022   Gonorrhea 09/10/2014   Tubal ovarian abscess 09/10/2014    PCP: Dulce Sellar   REFERRING PROVIDER: Dulce Sellar  REFERRING DIAG: Torticollis.   THERAPY DIAG:  Cervicalgia  Rationale for Evaluation and Treatment Rehabilitation  ONSET DATE: 03/10/22  SUBJECTIVE:                                                                                                                                                                                      SUBJECTIVE STATEMENT: Pt states onset of neck pain in am, no incident to report. States pian on R, head was stuck to the Left, she was unable to move neck. She Went to urgent care And ER x 2, finally given prednisone, that helped.  Missed work for a whole week, couldn't drive.  Works as Location manager, mostly Standing, walking.  Now pain is Significantly better, but still has some pain and tightness on R, when turning to the R. She states no previous occurrence of this in the past.    PERTINENT HISTORY: none  PAIN:  Are you having pain? Yes: NPRS scale: 3/10 Pain location: R side of neck/UT Pain description: tight, sore  Aggravating factors: head movement Relieving factors: none stated.    PRECAUTIONS: None  WEIGHT  BEARING RESTRICTIONS No  FALLS:  Has patient fallen in last 6 months? No   PLOF: Independent  PATIENT GOALS  Decreased pain.   OBJECTIVE:   DIAGNOSTIC FINDINGS:     COGNITION:  Overall cognitive status: Within functional limits for tasks assessed  POSTURE:   UPPER EXTREMITY ROM:  Shoulder ROM: WNL     Cervical ROM: WNL, pain with end range R rotation  UPPER EXTREMITY MMT:  Shoulders: 4+/5   JOINT MOBILITY TESTING:    PALPATION:  Tightness and tenderness in R UT, levator.  TODAY'S TREATMENT:   Therapeutic Exercise: Aerobic: Supine: Seated: cervical ROM/rot x 5 bil, flexion x 5;  Standing: shoulder rolls x 15, scap squeeze x 15;  Stretches: UT stretch 30 sec x 3 bil;  Neuromuscular Re-education: Manual Therapy: STM/TPR to R UT  Self Care:    PATIENT EDUCATION: Education details: PT POC, Exam findings, HEP Person educated: Patient Education method: Explanation, Demonstration, Tactile cues, Verbal cues, and Handouts Education comprehension: verbalized understanding, returned demonstration, verbal cues required, tactile cues required, and needs further education   HOME EXERCISE PROGRAM: Access Code: [email or web address] URL: [email or web address] Date: 04/10/2022 Prepared by: Sedalia Muta  Exercises - Seated Cervical Sidebending Stretch  - 2-3 x daily - 3 reps - 30 hold - Seated Cervical Rotation AROM  - 2 x daily - 1 sets - 5- 10 reps - Standing Backward Shoulder Rolls  - 2-3 x daily - 1 sets - 10 reps - Standing Scapular Retraction  - 2 x daily - 1-2 sets - 10 reps   ASSESSMENT:  CLINICAL IMPRESSION: Patient presents with primary complaint of increased pain in neck. She did have significant pain at onset, but has improved some since then. She has pain and muscle tightness in R neck musculature. She has good ROM, but pain with end ranges of motion. She has decreased posture for back, neck and shoulders, and will benefit from postural education  and strengthening. Pt with decreased ability for full functional activities due to pain. Pt to benefit from skilled PT to improve.    OBJECTIVE IMPAIRMENTS decreased activity tolerance, decreased mobility, decreased ROM, decreased strength, increased muscle spasms, improper body mechanics, and pain.   ACTIVITY LIMITATIONS carrying, lifting, and reach over head  PARTICIPATION LIMITATIONS: meal prep, cleaning, laundry, driving, shopping, community activity, and occupation  PERSONAL FACTORS  none  are also affecting patient's functional outcome.   REHAB POTENTIAL: Good  CLINICAL DECISION MAKING: Stable/uncomplicated  EVALUATION COMPLEXITY: Low   GOALS: Goals reviewed with patient? Yes  SHORT TERM GOALS: Target date: 04/24/2022    Pt to be independent with initial HEP  Goal status: INITIAL  2.  Pt to report decreased pain at end range of R rotation to 0-2/10   Goal status: INITIAL    LONG TERM GOALS: Target date: 05/22/2022    Pt to be independent with final HEP  Goal status: INITIAL  2.  Pt to report decreased pain in neck to 0-1/10 with activity and work duties.    Goal status: INITIAL  3.  Pt to demo soft tissue limitations to be improved by at least 75 % for improved pain in neck.   Goal status: INITIAL  4.  Pt to demo optimal shoulder and neck mechanics with UE elevation and reaching.    Goal status: INITIAL     PLAN: PT FREQUENCY: 1-2x/week  PT DURATION: 6 weeks  PLANNED INTERVENTIONS: Therapeutic exercises, Therapeutic activity, Neuromuscular re-education, Patient/Family education, Self Care, Joint mobilization, Joint manipulation, DME instructions, Dry Needling, Spinal manipulation, Spinal mobilization, Cryotherapy, Moist heat, Taping, Ultrasound, Ionotophoresis 4mg /ml Dexamethasone, and Manual therapy  PLAN FOR NEXT SESSION:    , PT 04/10/2022, 9:55 AM

## 2022-04-18 ENCOUNTER — Encounter: Payer: BC Managed Care – PPO | Admitting: Physical Therapy

## 2022-04-25 ENCOUNTER — Encounter: Payer: Self-pay | Admitting: Physical Therapy

## 2022-05-22 ENCOUNTER — Encounter: Payer: Self-pay | Admitting: *Deleted

## 2022-08-10 ENCOUNTER — Encounter: Payer: Self-pay | Admitting: *Deleted

## 2023-01-30 ENCOUNTER — Ambulatory Visit (INDEPENDENT_AMBULATORY_CARE_PROVIDER_SITE_OTHER): Payer: 59 | Admitting: Nurse Practitioner

## 2023-01-30 ENCOUNTER — Encounter: Payer: Self-pay | Admitting: Nurse Practitioner

## 2023-01-30 VITALS — BP 116/80 | HR 76 | Temp 97.5°F | Ht 66.0 in | Wt 239.6 lb

## 2023-01-30 DIAGNOSIS — M5416 Radiculopathy, lumbar region: Secondary | ICD-10-CM | POA: Diagnosis not present

## 2023-01-30 MED ORDER — PREDNISONE 10 MG PO TABS: ORAL_TABLET | ORAL | 0 refills | Status: DC

## 2023-01-30 MED ORDER — TRIAMCINOLONE ACETONIDE 40 MG/ML IJ SUSP
40.0000 mg | Freq: Once | INTRAMUSCULAR | Status: AC
  Administered 2023-01-30: 40 mg via INTRAMUSCULAR

## 2023-01-30 NOTE — Assessment & Plan Note (Addendum)
Sudden decrease in sensation to left lower leg and sharp pain to left upper thigh x 4 days. No patellar reflex in left leg. Discussed case with Dr. Veto Kemps. Consistent with nerve radiculopathy. 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  Will treat with Kenalog 40 mg IM injection in office today and prednisone taper starting tomorrow.  Will place referral to neurosurgery.  Follow-up with PCP in 2 weeks.

## 2023-01-30 NOTE — Patient Instructions (Signed)
It was great to see you!  Start prednisone tomorrow. Takes 6 tablets the first day, 5 tablets the next day, then keep decreasing by 1 every day until gone. Take this in your morning with food.   We are giving you a shot of kenalog in the office today.   I have placed a referral to neurosurgery, they will call to schedule an appointment.   Let's follow-up with Judeth Cornfield in 2 weeks.   Take care,  Rodman Pickle, NP

## 2023-01-30 NOTE — Progress Notes (Signed)
Acute Office Visit  Subjective:     Patient ID: Vanessa Stephenson, female    DOB: 1985-11-21, 37 y.o.   MRN: [id number]  Chief Complaint  Patient presents with   Leg Pain    Left leg pain with numbness in lower leg since Saturday    HPI Patient is in today for left upper thigh pain and left lower leg numbness that started on Saturday.  She states that she woke up Saturday morning with numbness from her knee down to her foot and sharp pain in her left upper thigh that starts in the front and wraps around to the back.  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  She rates the pain as a 9/10.  The pain is worse with standing and laying down.  She has tried Aleve, ibuprofen, Tylenol, Biofreeze which did not help at all.  She states that this has not changed in severity at all since Saturday.  She denies incontinence, back pain, and fevers.   ROS See pertinent positives and negatives per HPI.     Objective:    BP 116/80 (BP Location: Left Arm)   Pulse 76   Temp (!) 97.5 F (36.4 C)   Ht 5\' 6"  (1.676 m)   Wt 239 lb 9.6 oz (108.7 kg)   LMP 01/08/2023 (Exact Date)   SpO2 98%   BMI 38.67 kg/m    Physical Exam Vitals and nursing note reviewed.  Constitutional:      General: She is not in acute distress.    Appearance: Normal appearance.  HENT:     Head: Normocephalic.  Eyes:     Conjunctiva/sclera: Conjunctivae normal.  Pulmonary:     Effort: Pulmonary effort is normal.  Musculoskeletal:     Cervical back: Normal range of motion.     Comments: Strength 4/5 in left leg. Straight leg raise on left causes pain in left upper thigh  Skin:    General: Skin is warm.  Neurological:     Mental Status: She is alert and oriented to person, place, and time.     Sensory: Sensory deficit (decreased sensation below left knee) present.     Motor: Weakness (left leg) present.      Deep Tendon Reflexes: Reflexes abnormal (no patellar reflex left leg, right leg normal).  Psychiatric:        Mood and Affect: Mood normal.        Behavior: Behavior normal.        Thought Content: Thought content normal.        Judgment: Judgment normal.      Assessment & Plan:   Problem List Items Addressed This Visit       Nervous and Auditory   Lumbar radiculopathy - Primary    Sudden decrease in sensation to left lower leg and sharp pain to left upper thigh x 4 days. No patellar reflex in left leg. Discussed case with Dr. Veto Kemps. Consistent with nerve radiculopathy. 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  Will treat with Kenalog 40 mg IM injection in office today and prednisone taper starting tomorrow.  Will place referral to neurosurgery.  Follow-up with PCP in 2 weeks.      Relevant Orders   Ambulatory referral to Neurosurgery    Meds ordered this encounter  Medications  triamcinolone acetonide (KENALOG-40) injection 40 mg   predniSONE (DELTASONE) 10 MG tablet    Sig: Take 6 tablets today, then 5 tablets tomorrow, then decrease by 1 tablet every day until gone    Dispense:  21 tablet    Refill:  0    Return in about 2 weeks (around 02/13/2023) for with PCP.  Gerre Scull, NP

## 2023-01-31 ENCOUNTER — Encounter: Payer: Self-pay | Admitting: Nurse Practitioner

## 2023-02-02 MED ORDER — TIZANIDINE HCL 4 MG PO TABS: 4.0000 mg | ORAL_TABLET | Freq: Four times a day (QID) | ORAL | 0 refills | Status: AC | PRN

## 2023-02-02 NOTE — Addendum Note (Signed)
Addended by: Rodman Pickle A on: 02/02/2023 09:54 AM   Modules accepted: Orders

## 2023-02-13 ENCOUNTER — Ambulatory Visit (INDEPENDENT_AMBULATORY_CARE_PROVIDER_SITE_OTHER): Payer: 59 | Admitting: Family

## 2023-02-13 VITALS — BP 112/73 | HR 79 | Temp 97.3°F | Ht 66.0 in | Wt 235.8 lb

## 2023-02-13 DIAGNOSIS — M5416 Radiculopathy, lumbar region: Secondary | ICD-10-CM | POA: Diagnosis not present

## 2023-02-13 MED ORDER — GABAPENTIN 100 MG PO CAPS: 100.0000 mg | ORAL_CAPSULE | Freq: Three times a day (TID) | ORAL | 0 refills | Status: AC

## 2023-02-13 NOTE — Assessment & Plan Note (Addendum)
unsure if this is stemming form original injury last year pt reports only lumbar pain & tingling last year that did not radiate and eventually resolved her current pain started on 6/2, no relief w/pred taper, Tizanidine, or kenalog shot seen by [facility] and told to do PT & pt hesitant as it did not help her with her pain last year, pt upset they wouldn't do any imaging; advised PT may be of benefit this time  sending new referral to sports med  sending trial of Gabapentin, advised on use & SE f/u prn

## 2023-02-13 NOTE — Patient Instructions (Signed)
It was very nice to see you today!   I have sent a new medication to try to help your neuropathy and pain.  Start with 1 pill 3 times per day. If no relief can increase to 2 pills, but this may increase risk of drowsiness or dizziness.  I have sent a new referral to our Sports Medicine office.      PLEASE NOTE:  If you had any lab tests please let us know if you have not heard back within a few days. You may see your results on MyChart before we have a chance to review them but we will give you a call once they are reviewed by [place]. If we ordered any referrals today, please let us know if you have not heard from their office within the next week.

## 2023-02-13 NOTE — Progress Notes (Signed)
Patient ID: Vanessa Stephenson, female    DOB: 08-01-1986, 37 y.o.   MRN: [id number]  Chief Complaint  Patient presents with   Leg Pain    Pt c/o left leg pain and numbness from knee down for 3 weeks, Given prednisone and kenalog on 01/30/2023.     HPI: Left leg pain:  pt was seen on 6/4 for same problem and given a prednisone pack and kenalog injection, but states it did not help at all. She reports she had a work injury last year with her lumbar nerve, no radiculopathy, she had PT then and it resolved.  Had been doing fine until recently. Woke up 6/2 with pain, tingling and numbness starting in her left hip and radiating down the front of her leg, and lower leg is numb to touch. Seen by [facility] and told to do PT for 6-8w and then they could do an MRI. Pt reports no pain or tingling when she is sitting, only when standing or walking, but the lower leg numbness is all the time.    Assessment & Plan:  Lumbar radiculopathy Assessment & Plan: unsure if this is stemming form original injury last year pt reports only lumbar pain & tingling last year that did not radiate and eventually resolved her current pain started on 6/2, no relief w/pred taper, Tizanidine, or kenalog shot seen by [facility] and told to do PT & pt hesitant as it did not help her with her pain last year, pt upset they wouldn't do any imaging; advised PT may be of benefit this time  sending new referral to sports med  sending trial of Gabapentin, advised on use & SE f/u prn  Orders: -     Gabapentin; Take 1-2 capsules (100-200 mg total) by mouth 3 (three) times daily for 10 days. May cause drowsiness.  Dispense: 60 capsule; Refill: 0 -     Ambulatory referral to Sports Medicine   Subjective:    Outpatient Medications Prior to Visit  Medication Sig Dispense Refill   tiZANidine (ZANAFLEX) 4 MG tablet Take 1 tablet (4 mg total) by mouth every 6 (six) hours as needed for muscle spasms. 30 tablet 0    Ferrous Sulfate 143 (45 Fe) MG TBCR Take 1 tablet by mouth daily after breakfast. (Patient not taking: Reported on 02/13/2023) 90 tablet 1   predniSONE (DELTASONE) 10 MG tablet Take 6 tablets today, then 5 tablets tomorrow, then decrease by 1 tablet every day until gone (Patient not taking: Reported on 02/13/2023) 21 tablet 0   No facility-administered medications prior to visit.   No past medical history on file. Past Surgical History:  Procedure Laterality Date   PELVIC ABCESS DRAINAGE     No Known Allergies    Objective:    Physical Exam Vitals and nursing note reviewed.  Constitutional:      Appearance: Normal appearance.  Cardiovascular:     Rate and Rhythm: Normal rate and regular rhythm.  Pulmonary:     Effort: Pulmonary effort is normal.     Breath sounds: Normal breath sounds.  Musculoskeletal:        General: Normal range of motion.  Skin:    General: Skin is warm and dry.  Neurological:     Mental Status: She is alert.     Motor: Weakness (with numbness in left lower leg) present.     Coordination: Heel to [facility] Test abnormal (left leg).  Psychiatric:  Mood and Affect: Mood normal.        Behavior: Behavior normal.    BP 112/73 (BP Location: Left Arm, Patient Position: Sitting, Cuff Size: Large)   Pulse 79   Temp (!) 97.3 F (36.3 C) (Temporal)   Ht 5\' 6"  (1.676 m)   Wt 235 lb 12.8 oz (107 kg)   LMP 01/08/2023 (Exact Date)   SpO2 100%   BMI 38.06 kg/m  Wt Readings from Last 3 Encounters:  02/13/23 235 lb 12.8 oz (107 kg)  01/30/23 239 lb 9.6 oz (108.7 kg)  03/31/22 236 lb 8 oz (107.3 kg)       Dulce Sellar, NP

## 2023-02-20 ENCOUNTER — Other Ambulatory Visit: Payer: Self-pay | Admitting: Family

## 2023-02-20 DIAGNOSIS — M5416 Radiculopathy, lumbar region: Secondary | ICD-10-CM

## 2024-02-01 ENCOUNTER — Ambulatory Visit (INDEPENDENT_AMBULATORY_CARE_PROVIDER_SITE_OTHER): Payer: Self-pay | Admitting: Family

## 2024-02-01 ENCOUNTER — Other Ambulatory Visit (HOSPITAL_COMMUNITY)
Admission: RE | Admit: 2024-02-01 | Discharge: 2024-02-01 | Disposition: A | Discharge status: Home / Self Care | Source: Ambulatory Visit | Attending: Family | Admitting: Family

## 2024-02-01 VITALS — BP 100/72 | HR 78 | Temp 98.4°F | Ht 66.0 in | Wt 233.2 lb

## 2024-02-01 DIAGNOSIS — N3090 Cystitis, unspecified without hematuria: Secondary | ICD-10-CM | POA: Diagnosis not present

## 2024-02-01 DIAGNOSIS — N898 Other specified noninflammatory disorders of vagina: Secondary | ICD-10-CM | POA: Diagnosis present

## 2024-02-01 DIAGNOSIS — B3731 Acute candidiasis of vulva and vagina: Secondary | ICD-10-CM

## 2024-02-01 LAB — POC URINALSYSI DIPSTICK (AUTOMATED)
Bilirubin, UA: NEGATIVE
Blood, UA: NEGATIVE
Glucose, UA: NEGATIVE
Ketones, UA: 5
Nitrite, UA: NEGATIVE
Protein, UA: NEGATIVE
Spec Grav, UA: >=1.03 — AB (ref 1.010–1.025)
Urobilinogen, UA: 0.2 U/dL
pH, UA: 5.5 (ref 5.0–8.0)

## 2024-02-01 MED ORDER — METRONIDAZOLE 0.75 % VA GEL: 1.0000 | Freq: Two times a day (BID) | VAGINAL | 0 refills | Status: AC

## 2024-02-01 MED ORDER — SULFAMETHOXAZOLE-TRIMETHOPRIM 800-160 MG PO TABS: 1.0000 | ORAL_TABLET | Freq: Two times a day (BID) | ORAL | 0 refills | Status: AC

## 2024-02-01 NOTE — Progress Notes (Signed)
 Patient ID: Vanessa Stephenson, female    DOB: 1986-07-16, 38 y.o.   MRN: [id number]  Chief Complaint  Patient presents with   Vaginal Discharge    Discharge started a couple days ago. Has a light smell. States it's light yellow. Asking for STD and Uti testing. Switched from tide to tide sensitive skin.    History of Present Illness Vanessa Stephenson is a 38 year old female who presents with vaginal discharge.  She experiences a light yellow, thin vaginal discharge with a noticeable odor, using a panty liner for management. She suspects bacterial vaginosis due to similar past symptoms, although the odor is not particularly fishy. No pelvic pain or burning with urination. No recent antibiotic use and denies diarrhea. No burning, cloudy, or dark urine. She mentions holding her urine more than usual at work. She recalls a bladder infection as a teenager but not recently. She has not undergone a swab test yet but has provided a urine sample for analysis. In her social history, she takes hot baths but avoids bubble baths to prevent irritation.  Assessment & Plan Urinary Tract Infection (UTI) Foul odor, no dysuria, cloudy/dark color, hematuria, frequency or urgency. Confirmed UTI via urinalysis. Risk factors include infrequent urination and possible inadequate hydration. - Prescribe Bactrim for UTI, advised use & SE. - Ensure hydration of 2L water daily. Do not wait longer  - Send urine for culture to ensure antibiotic efficacy and adjust if resistance is detected. - Will notify results on MyChart  Vaginal Discharge Suspected bacterial vaginosis pending confirmation via swab test. Discussed BV as an imbalance in vaginal microbiome causing increased alkalinity. Boric acid vaginal suppositories suggested if BV is confirmed. - Perform swab test for BV and STDs, will not go out until Monday d/t missing cut off time for Friday/ - Will send vaginal Flagyl to start pending swab  results. - Advise use of vaginal sanitary wipes. - Educate on avoiding douching and hot bubble baths.   Subjective:     Outpatient Medications Prior to Visit  Medication Sig Dispense Refill   Ferrous Sulfate  143 (45 Fe) MG TBCR Take 1 tablet by mouth daily after breakfast. (Patient not taking: Reported on 02/01/2024) 90 tablet 1   gabapentin  (NEURONTIN ) 100 MG capsule Take 1-2 capsules (100-200 mg total) by mouth 3 (three) times daily for 10 days. May cause drowsiness. 60 capsule 0   tiZANidine  (ZANAFLEX ) 4 MG tablet Take 1 tablet (4 mg total) by mouth every 6 (six) hours as needed for muscle spasms. (Patient not taking: Reported on 02/01/2024) 30 tablet 0   No facility-administered medications prior to visit.   No past medical history on file. Past Surgical History:  Procedure Laterality Date   PELVIC ABCESS DRAINAGE     No Known Allergies    Objective:    Physical Exam Vitals and nursing note reviewed.  Constitutional:      Appearance: Normal appearance.  Cardiovascular:     Rate and Rhythm: Normal rate and regular rhythm.  Pulmonary:     Effort: Pulmonary effort is normal.     Breath sounds: Normal breath sounds.  Musculoskeletal:        General: Normal range of motion.  Skin:    General: Skin is warm and dry.  Neurological:     Mental Status: She is alert.  Psychiatric:        Mood and Affect: Mood normal.        Behavior: Behavior normal.    BP  100/72   Pulse 78   Temp 98.4 F (36.9 C) (Temporal)   Ht 5\' 6"  (1.676 m)   Wt 233 lb 3.2 oz (105.8 kg)   SpO2 98%   BMI 37.64 kg/m  Wt Readings from Last 3 Encounters:  02/01/24 233 lb 3.2 oz (105.8 kg)  02/13/23 235 lb 12.8 oz (107 kg)  01/30/23 239 lb 9.6 oz (108.7 kg)       Versa Gore, NP

## 2024-02-02 LAB — URINE CULTURE
MICRO NUMBER:: 16549124
SPECIMEN QUALITY:: ADEQUATE

## 2024-02-05 LAB — CERVICOVAGINAL ANCILLARY ONLY
Bacterial Vaginitis (gardnerella): POSITIVE — AB
Candida Glabrata: NEGATIVE
Candida Vaginitis: POSITIVE — AB
Chlamydia: NEGATIVE
Comment: NEGATIVE
Comment: NEGATIVE
Comment: NEGATIVE
Comment: NEGATIVE
Comment: NEGATIVE
Comment: NORMAL
Neisseria Gonorrhea: NEGATIVE
Trichomonas: NEGATIVE

## 2024-02-05 MED ORDER — FLUCONAZOLE 150 MG PO TABS: ORAL_TABLET | ORAL | 0 refills | Status: AC

## 2024-02-05 NOTE — Telephone Encounter (Signed)
 Yes, I have sent in Diflucan for her to take, 2 pills, 1 today and 2nd in 3 days. Yeast infection sometimes develops after taking antibiotic.
